# Patient Record
Sex: Male | Born: 1955 | Hispanic: Yes | Marital: Married | Smoking: Former smoker
Health system: Southern US, Community
[De-identification: ages and names within clinical notes are randomized; demographics above are authoritative.]

## PROBLEM LIST (undated history)

## (undated) DIAGNOSIS — I10 Essential (primary) hypertension: Secondary | ICD-10-CM

## (undated) DIAGNOSIS — E119 Type 2 diabetes mellitus without complications: Secondary | ICD-10-CM

## (undated) DIAGNOSIS — E7800 Pure hypercholesterolemia, unspecified: Secondary | ICD-10-CM

## (undated) HISTORY — PX: HERNIA REPAIR: SHX51

---

## 2015-07-13 ENCOUNTER — Emergency Department (HOSPITAL_COMMUNITY)
Admission: EM | Admit: 2015-07-13 | Discharge: 2015-07-13 | Disposition: A | Discharge status: Home / Self Care | Payer: Worker's Compensation | Attending: Orthopedic Surgery | Admitting: Orthopedic Surgery

## 2015-07-13 ENCOUNTER — Emergency Department (HOSPITAL_COMMUNITY): Payer: Worker's Compensation

## 2015-07-13 ENCOUNTER — Emergency Department (HOSPITAL_COMMUNITY): Payer: Worker's Compensation | Admitting: Registered Nurse

## 2015-07-13 ENCOUNTER — Inpatient Hospital Stay: Admit: 2015-07-13 | Payer: Self-pay | Admitting: Orthopedic Surgery

## 2015-07-13 ENCOUNTER — Encounter (HOSPITAL_COMMUNITY): Admission: EM | Disposition: A | Discharge status: Home / Self Care | Payer: Self-pay | Source: Home / Self Care

## 2015-07-13 ENCOUNTER — Encounter (HOSPITAL_COMMUNITY): Payer: Self-pay

## 2015-07-13 DIAGNOSIS — Z87891 Personal history of nicotine dependence: Secondary | ICD-10-CM | POA: Diagnosis not present

## 2015-07-13 DIAGNOSIS — S61341A Puncture wound with foreign body of left index finger with damage to nail, initial encounter: Secondary | ICD-10-CM | POA: Diagnosis not present

## 2015-07-13 DIAGNOSIS — S62609A Fracture of unspecified phalanx of unspecified finger, initial encounter for closed fracture: Secondary | ICD-10-CM

## 2015-07-13 DIAGNOSIS — Y9263 Factory as the place of occurrence of the external cause: Secondary | ICD-10-CM | POA: Insufficient documentation

## 2015-07-13 DIAGNOSIS — S62631B Displaced fracture of distal phalanx of left index finger, initial encounter for open fracture: Secondary | ICD-10-CM | POA: Insufficient documentation

## 2015-07-13 DIAGNOSIS — W3182XA Contact with other commercial machinery, initial encounter: Secondary | ICD-10-CM | POA: Insufficient documentation

## 2015-07-13 DIAGNOSIS — E7800 Pure hypercholesterolemia, unspecified: Secondary | ICD-10-CM | POA: Insufficient documentation

## 2015-07-13 DIAGNOSIS — Y9389 Activity, other specified: Secondary | ICD-10-CM | POA: Diagnosis not present

## 2015-07-13 DIAGNOSIS — I10 Essential (primary) hypertension: Secondary | ICD-10-CM | POA: Diagnosis not present

## 2015-07-13 DIAGNOSIS — E119 Type 2 diabetes mellitus without complications: Secondary | ICD-10-CM | POA: Diagnosis not present

## 2015-07-13 DIAGNOSIS — S6992XA Unspecified injury of left wrist, hand and finger(s), initial encounter: Secondary | ICD-10-CM | POA: Diagnosis present

## 2015-07-13 HISTORY — PX: OPEN REDUCTION INTERNAL FIXATION (ORIF) METACARPAL: SHX6234

## 2015-07-13 HISTORY — DX: Essential (primary) hypertension: I10

## 2015-07-13 HISTORY — DX: Type 2 diabetes mellitus without complications: E11.9

## 2015-07-13 HISTORY — DX: Pure hypercholesterolemia, unspecified: E78.00

## 2015-07-13 LAB — GLUCOSE, CAPILLARY: Glucose-Capillary: 131 mg/dL — ABNORMAL HIGH (ref 65–99)

## 2015-07-13 SURGERY — OPEN REDUCTION INTERNAL FIXATION (ORIF) METACARPAL
Anesthesia: Monitor Anesthesia Care | Site: Finger | Laterality: Left

## 2015-07-13 MED ORDER — MIDAZOLAM HCL 5 MG/5ML IJ SOLN
INTRAMUSCULAR | Status: DC | PRN
  Administered 2015-07-13: 1 mg via INTRAVENOUS

## 2015-07-13 MED ORDER — HYDROMORPHONE HCL 1 MG/ML IJ SOLN: 0.2500 mg | INTRAMUSCULAR | Status: DC | PRN

## 2015-07-13 MED ORDER — CEFAZOLIN SODIUM-DEXTROSE 2-3 GM-% IV SOLR
2.0000 g | Freq: Once | INTRAVENOUS | Status: AC
  Administered 2015-07-13: 2 g via INTRAVENOUS

## 2015-07-13 MED ORDER — LACTATED RINGERS IV SOLN
INTRAVENOUS | Status: DC | PRN
  Administered 2015-07-13: 18:00:00 via INTRAVENOUS

## 2015-07-13 MED ORDER — ONDANSETRON HCL 4 MG/2ML IJ SOLN
INTRAMUSCULAR | Status: DC | PRN
  Administered 2015-07-13: 4 mg via INTRAVENOUS

## 2015-07-13 MED ORDER — LABETALOL HCL 5 MG/ML IV SOLN
INTRAVENOUS | Status: DC | PRN
  Administered 2015-07-13: 2.5 mg via INTRAVENOUS

## 2015-07-13 MED ORDER — OXYCODONE-ACETAMINOPHEN 5-325 MG PO TABS: 1.0000 | ORAL_TABLET | ORAL | Status: AC | PRN

## 2015-07-13 MED ORDER — AMOXICILLIN-POT CLAVULANATE 875-125 MG PO TABS: 1.0000 | ORAL_TABLET | Freq: Two times a day (BID) | ORAL | Status: AC

## 2015-07-13 MED ORDER — FENTANYL CITRATE (PF) 100 MCG/2ML IJ SOLN
INTRAMUSCULAR | Status: DC | PRN
  Administered 2015-07-13: 50 ug via INTRAVENOUS

## 2015-07-13 MED ORDER — 0.9 % SODIUM CHLORIDE (POUR BTL) OPTIME
TOPICAL | Status: DC | PRN
  Administered 2015-07-13: 1000 mL

## 2015-07-13 MED ORDER — PROMETHAZINE HCL 25 MG/ML IJ SOLN: 6.2500 mg | INTRAMUSCULAR | Status: DC | PRN

## 2015-07-13 MED ORDER — LIDOCAINE HCL 1 % IJ SOLN
INTRAMUSCULAR | Status: DC | PRN
  Administered 2015-07-13: 5 mL

## 2015-07-13 MED ORDER — PROPOFOL 10 MG/ML IV BOLUS
INTRAVENOUS | Status: DC | PRN
  Administered 2015-07-13 (×3): 20 mg via INTRAVENOUS

## 2015-07-13 MED ORDER — LIDOCAINE HCL 2 % IJ SOLN
INTRAMUSCULAR | Status: DC | PRN
  Administered 2015-07-13: 5 mL

## 2015-07-13 SURGICAL SUPPLY — 59 items
BAG ZIPLOCK 12X15 (MISCELLANEOUS) IMPLANT
BENZOIN TINCTURE PRP APPL 2/3 (GAUZE/BANDAGES/DRESSINGS) ×3 IMPLANT
BLADE MINI RND TIP GREEN BEAV (BLADE) IMPLANT
BNDG COHESIVE 1X5 TAN STRL LF (GAUZE/BANDAGES/DRESSINGS) ×3 IMPLANT
BNDG CONFORM 3 STRL LF (GAUZE/BANDAGES/DRESSINGS) IMPLANT
BNDG GAUZE ELAST 4 BULKY (GAUZE/BANDAGES/DRESSINGS) IMPLANT
CHLORAPREP W/TINT 26ML (MISCELLANEOUS) IMPLANT
CLOSURE WOUND 1/2 X4 (GAUZE/BANDAGES/DRESSINGS) ×1
CORDS BIPOLAR (ELECTRODE) ×3 IMPLANT
CUFF TOURN SGL QUICK 18 (TOURNIQUET CUFF) ×3 IMPLANT
DECANTER SPIKE VIAL GLASS SM (MISCELLANEOUS) ×3 IMPLANT
DEPRESSOR TONGUE BLADE STERILE (MISCELLANEOUS) ×3 IMPLANT
DRAPE C-ARM 42X120 X-RAY (DRAPES) ×3 IMPLANT
DRAPE SURG 17X11 SM STRL (DRAPES) ×3 IMPLANT
ELECT REM PT RETURN 9FT ADLT (ELECTROSURGICAL)
ELECTRODE REM PT RTRN 9FT ADLT (ELECTROSURGICAL) IMPLANT
GAUZE SPONGE 4X4 12PLY STRL (GAUZE/BANDAGES/DRESSINGS) ×3 IMPLANT
GAUZE SPONGE 4X4 16PLY XRAY LF (GAUZE/BANDAGES/DRESSINGS) ×3 IMPLANT
GAUZE XEROFORM 1X8 LF (GAUZE/BANDAGES/DRESSINGS) ×3 IMPLANT
GLOVE BIO SURGEON STRL SZ7.5 (GLOVE) ×3 IMPLANT
GLOVE BIO SURGEON STRL SZ8 (GLOVE) IMPLANT
GLOVE BIOGEL PI IND STRL 7.0 (GLOVE) ×1 IMPLANT
GLOVE BIOGEL PI INDICATOR 7.0 (GLOVE) ×2
GLOVE INDICATOR 8.0 STRL GRN (GLOVE) ×3 IMPLANT
GLOVE SURG SS PI 7.0 STRL IVOR (GLOVE) ×3 IMPLANT
GOWN STRL REUS W/TWL XL LVL3 (GOWN DISPOSABLE) ×6 IMPLANT
KIT BASIN OR (CUSTOM PROCEDURE TRAY) ×3 IMPLANT
MANIFOLD NEPTUNE II (INSTRUMENTS) ×3 IMPLANT
NEEDLE HYPO 25X1 1.5 SAFETY (NEEDLE) ×3 IMPLANT
NS IRRIG 1000ML POUR BTL (IV SOLUTION) ×3 IMPLANT
PACK ORTHO EXTREMITY (CUSTOM PROCEDURE TRAY) ×3 IMPLANT
PAD CAST 3X4 CTTN HI CHSV (CAST SUPPLIES) ×1 IMPLANT
PAD CAST 4YDX4 CTTN HI CHSV (CAST SUPPLIES) ×1 IMPLANT
PADDING CAST COTTON 3X4 STRL (CAST SUPPLIES) ×2
PADDING CAST COTTON 4X4 STRL (CAST SUPPLIES) ×2
PASSER SUT SWANSON 36MM LOOP (INSTRUMENTS) IMPLANT
POSITIONER SURGICAL ARM (MISCELLANEOUS) IMPLANT
SOL PREP POV-IOD 4OZ 10% (MISCELLANEOUS) ×3 IMPLANT
SPEAR EYE SURGICAL ST (MISCELLANEOUS) IMPLANT
SPLINT PLASTER CAST XFAST 5X30 (CAST SUPPLIES) IMPLANT
SPLINT PLASTER XFAST SET 5X30 (CAST SUPPLIES)
STAPLER VISISTAT 35W (STAPLE) IMPLANT
STOCKINETTE 4X48 STRL (DRAPES) ×3 IMPLANT
STOCKINETTE 6  STRL (DRAPES)
STOCKINETTE 6 STRL (DRAPES) IMPLANT
STRIP CLOSURE SKIN 1/2X4 (GAUZE/BANDAGES/DRESSINGS) ×2 IMPLANT
SUT CHROMIC 5 0 P 3 (SUTURE) IMPLANT
SUT CHROMIC 7 0 TG140 8 (SUTURE) ×3 IMPLANT
SUT ETHILON 9 0 V 100.4 (SUTURE) IMPLANT
SUT FIBERWIRE 4-0 18 TAPR NDL (SUTURE)
SUT MERSILENE 4 0 P 3 (SUTURE) IMPLANT
SUT PROLENE 3 0 PS 2 (SUTURE) IMPLANT
SUT PROLENE 4 0 PS 2 18 (SUTURE) IMPLANT
SUT SILK 2 0 (SUTURE)
SUT SILK 2-0 18XBRD TIE 12 (SUTURE) IMPLANT
SUT VICRYL RAPIDE 4/0 PS 2 (SUTURE) ×3 IMPLANT
SUTURE FIBERWR 4-0 18 TAPR NDL (SUTURE) IMPLANT
TOWEL OR 17X26 10 PK STRL BLUE (TOWEL DISPOSABLE) ×3 IMPLANT
WATER STERILE IRR 1500ML POUR (IV SOLUTION) IMPLANT

## 2015-07-13 NOTE — Discharge Instructions (Signed)
Discharge Instructions   You have a dressing with a splint incorporated in it. Move your fingers as much as possible, making a full fist and fully opening the fist. Elevate your hand to reduce pain & swelling of the digits.  Ice over the operative site may be helpful to reduce pain & swelling.  DO NOT USE HEAT. Pain medicine has been prescribed for you.  Use your medicine as needed over the first 48 hours, and then you can begin to taper your use.  You may use Tylenol in place of your prescribed pain medication, but not IN ADDITION to it. Leave the dressing in place until you return to our office.  You may shower, but keep the bandage clean & dry.  You may drive a car when you are off of prescription pain medications and can safely control your vehicle with both hands.   Please call [phone] during normal business hours or [phone] after hours for any problems. Including the following:  - excessive redness of the incisions - drainage for more than 4 days - fever of more than 101.5 F  *Please note that pain medications will not be refilled after hours or on weekends.

## 2015-07-13 NOTE — ED Notes (Addendum)
Pt presents for hand surgery from [facility] ED.  Janee Mornhompson MD aware of Pt and sts OR is ready.   Pain score 8/10.  Pt speaks Spanish.        Pt reports having a drink and eating an orange around 1330.

## 2015-07-13 NOTE — H&P (Signed)
  ORTHOPAEDIC CONSULTATION HISTORY & PHYSICAL REQUESTING PHYSICIAN: No att. providers found  Chief Complaint: left index finger injury  HPI: Chris Curtis is a 60 y.o. male who works at [employer], and was injured earlier today, with a T-nut having been driven through the distal phalanx of his left index finger while wearing a glove.  He was evaluated at the emergency department in [place], and I was contacted by his employer and case manager, requesting that I provide further definitive care.  Arrangements were made for him to proceed to the emergency department at [facility], where I met the patient, evaluated the injury, and prepared for operative treatment.  Past Medical History  Diagnosis Date  . Diabetes mellitus without complication ([place])   . Hypertension   . High cholesterol    Past Surgical History  Procedure Laterality Date  . Hernia repair     Social History   Social History  . Marital Status: Married    Spouse Name: N/A  . Number of Children: N/A  . Years of Education: N/A   Social History Main Topics  . Smoking status: Former Research scientist (life sciences)  . Smokeless tobacco: None  . Alcohol Use: Yes     Comment: occ  . Drug Use: None  . Sexual Activity: Not Asked   Other Topics Concern  . None   Social History Narrative  . None   History reviewed. No pertinent family history. Allergies not on file Prior to Admission medications   Not on File   No results found.  Positive ROS: All other systems have been reviewed and were otherwise negative with the exception of those mentioned in the HPI and as above.  Physical Exam: Vitals: Refer to EMR. Constitutional:  WD, WN, NAD HEENT:  NCAT, EOMI Neuro/Psych:  Alert & oriented to person, place, and time; appropriate mood & affect Lymphatic: No generalized extremity edema or lymphadenopathy Extremities / MSK:  The extremities are normal with respect to appearance, ranges of motion, joint stability, muscle  strength/tone, sensation, & perfusion except as otherwise noted:  Left index finger has an impaled object that passes completely from dorsal to volar through the distal phalanx.  There are portions of the glove that have remained under the metal, the remainder of it cut away.  All of the joints of the digit can flex and extend, and the fingertip appears viable with regard to color and temperature.  Xrays:  3 views of the left index finger available from [place] revealed a metallic object through the distal phalanx, complete with traversing the digit, with a comminuted distal phalangeal fracture.  Assessment: Impaled metal object traversing the left index finger, with comminuted distal phalangeal fracture and likely significant damage to the nail complex.  Plan: I discussed with him via an interpreter a plan to address this in the operating room with sufficient anesthesia.  The neurological obviously need to be removed, at which time I observe the structures and assess the degree of damage to the involved structures and the effect repairs as able.  Questions were invited and answered.  Consent obtained.  Rayvon Char [address], [place], [place]  [address] Office: [phone] Mobile: [phone]  07/13/2015, 6:27 PM

## 2015-07-13 NOTE — Anesthesia Postprocedure Evaluation (Signed)
Anesthesia Post Note  Patient: Chris Curtis  Procedure(s) Performed: Procedure(s) (LRB): REMOVAL FOREIGN OBJECT LEFT INDEX FINGER/ OPEN TREATMENT DISTAL PHALYNX FRACTURE/ NAILBED REPAIR LEFT INDEX FINGER (Left)  Patient location during evaluation: PACU Anesthesia Type: MAC Level of consciousness: awake Pain management: pain level controlled Vital Signs Assessment: post-procedure vital signs reviewed and stable Respiratory status: spontaneous breathing Cardiovascular status: stable Anesthetic complications: no    Last Vitals:  Filed Vitals:   07/13/15 2030 07/13/15 2036  BP: 159/84 151/82  Pulse: 58 57  Temp:  36.3 C  Resp: 14 11    Last Pain:  Filed Vitals:   07/13/15 2041  PainSc: 8                  EDWARDS,Gionni Vaca

## 2015-07-13 NOTE — Op Note (Signed)
07/13/2015  6:35 PM  PATIENT:  Chris Curtis  60 y.o. male  PRE-OPERATIVE DIAGNOSIS:  Left index finger trauma with traversing metallic foreign body, comminuted distal phalangeal fracture, and significant soft tissue trauma  POST-OPERATIVE DIAGNOSIS:  Same  PROCEDURE:  Left index finger removal of traversing metallic foreign body; debridement of distal phalanx open fracture to include skin, subcutaneous tissues, and bone; open treatment of open fracture of distal phalanx; nail plate removal; nailbed repair; simple repair of skin laceration, 1.5 cm  SURGEON: Cliffton Astersavid A. Janee Mornhompson, MD  PHYSICIAN ASSISTANT: None  ANESTHESIA:  MAC  SPECIMENS:  None  DRAINS:   None  EBL:  less than 50 mL  PREOPERATIVE INDICATIONS:  Chris Curtis is a  60 y.o. male with an impaled traversing foreign body across the distal phalanx of the left index finger, an industrial T-nut associated with furniture manufacturing  The risks benefits and alternatives were discussed with the patient preoperatively including but not limited to the risks of infection, bleeding, nerve injury, cardiopulmonary complications, the need for revision surgery, among others, and the patient verbalized understanding and consented to proceed.  OPERATIVE IMPLANTS: None  OPERATIVE PROCEDURE:  After receiving prophylactic antibiotics, the patient was escorted to the operative theatre and placed in a supine position.  A surgical "time-out" was performed during which the planned procedure, proposed operative site, and the correct patient identity were compared to the operative consent and agreement confirmed by the circulating nurse according to current facility policy.  A digital block was performed by me with a mixture of lidocaine and Marcaine without epinephrine.  Following application of a tourniquet to the operative extremity, the exposed skin was prepped with Betadine and draped in the usual sterile fashion.    I initially used pliers  to decrease the size of the flare of the metal, allowing it to be then removed from the dorsum.  The nail plate was then removed from the nail bed.  The wound was copiously irrigated, and the tourniquet was inflated.  The damage was surveyed, which revealed extensive comminution to the midportion of the distal phalanx.  The skin edges were excisionally debrided, and some bone fragments were removed as well.  Some of the intervening bone fragments in the area of comminution were removed so that the remaining nail bed could be better approximated, resulting in slight shortening of the digit overall.  2 different 0.045 inch K wires were driven roughly parallel one slightly radial one slightly lateral to provide support for the comminuted fragments in the overlying nail bed.  These were clipped, ultimately they were bent over distally to lie atop the repaired nailbed.  The nail bed repair was performed with 7-0 chromic suture and loupe magnification.  There was reasonable coverage of nailbed in the region of the sterile matrix.  The germinal matrix was largely unaffected by the injury.  Final images were obtained.  The skin split on the volar and distal portion of the digit was reapproximated with 4-0 Vicryl Rapide interrupted suture.  Xeroform was placed on the nail bed and into the nail fold and then the K wires were fully bent over some that they were lying atop the Xeroform.  The tip of the digit was prepared with benzoin and Steri-Strips were used to help secure the pins from becoming dislodged distally as their purchase in the proximal portion of the phalanx was a little tenuous.  The remainder the Betadine was cleansed from the hand and digit and the digit was dressed,  with a dorsal tongue blade component to help splint the distal and middle phalanges.  The tourniquet was released.  He was taken to the recovery room in stable condition.  DISPOSITION: He'll be discharged home with analgesics and oral  antibiotics, with follow-up in the middle of next week at which time a custom protective splint Chris be fabricated.  I plan to leave the Steri-Strips and Xeroform in place for now.

## 2015-07-13 NOTE — Anesthesia Preprocedure Evaluation (Addendum)
Anesthesia Evaluation  Patient identified by MRN, date of birth, ID band Patient awake    Reviewed: Allergy & Precautions, NPO status , Patient's Chart, lab work & pertinent test results  Airway Mallampati: II  TM Distance: >3 FB Neck ROM: Full    Dental   Pulmonary former smoker,    breath sounds clear to auscultation       Cardiovascular hypertension,  Rhythm:Regular Rate:Normal     Neuro/Psych    GI/Hepatic negative GI ROS, Neg liver ROS,   Endo/Other  diabetes  Renal/GU negative Renal ROS     Musculoskeletal   Abdominal   Peds  Hematology   Anesthesia Other Findings   Reproductive/Obstetrics                           Anesthesia Physical Anesthesia Plan  ASA: II  Anesthesia Plan:    Post-op Pain Management:    Induction: Intravenous  Airway Management Planned:   Additional Equipment:   Intra-op Plan:   Post-operative Plan:   Informed Consent: I have reviewed the patients History and Physical, chart, labs and discussed the procedure including the risks, benefits and alternatives for the proposed anesthesia with the patient or authorized representative who has indicated his/her understanding and acceptance.   Dental advisory given  Plan Discussed with: CRNA, Anesthesiologist and Surgeon  Anesthesia Plan Comments:         Anesthesia Quick Evaluation

## 2015-07-13 NOTE — Transfer of Care (Signed)
Immediate Anesthesia Transfer of Care Note  Patient: Chris Curtis  Procedure(s) Performed: Procedure(s): REMOVAL FOREIGN OBJECT LEFT INDEX FINGER/ OPEN TREATMENT DISTAL PHALYNX FRACTURE/ NAILBED REPAIR LEFT INDEX FINGER (Left)  Patient Location: PACU  Anesthesia Type:MAC  Level of Consciousness: awake, alert , oriented and patient cooperative  Airway & Oxygen Therapy: Patient Spontanous Breathing  Post-op Assessment: Report given to RN, Post -op Vital signs reviewed and stable and Patient moving all extremities X 4  Post vital signs: stable  Last Vitals:  Filed Vitals:   07/13/15 1812 07/13/15 1953  BP: 179/94   Pulse: 76 64  Temp: 36.9 C   Resp: 16 12    Complications: No apparent anesthesia complications

## 2015-07-14 ENCOUNTER — Encounter (HOSPITAL_COMMUNITY): Payer: Self-pay | Admitting: Orthopedic Surgery

## 2017-09-13 IMAGING — RF DG HAND COMPLETE 3+V*L*
1 series · 4 of 4 positions shown · non-contrast
Comparison: None.

CLINICAL DATA: ORIF left index finger with foreign body removal. 10
seconds fluoro time.

EXAM:
LEFT HAND - COMPLETE 3+ VIEW

[Series 1: run · 4 of 4 slices shown]
[im 1/4]
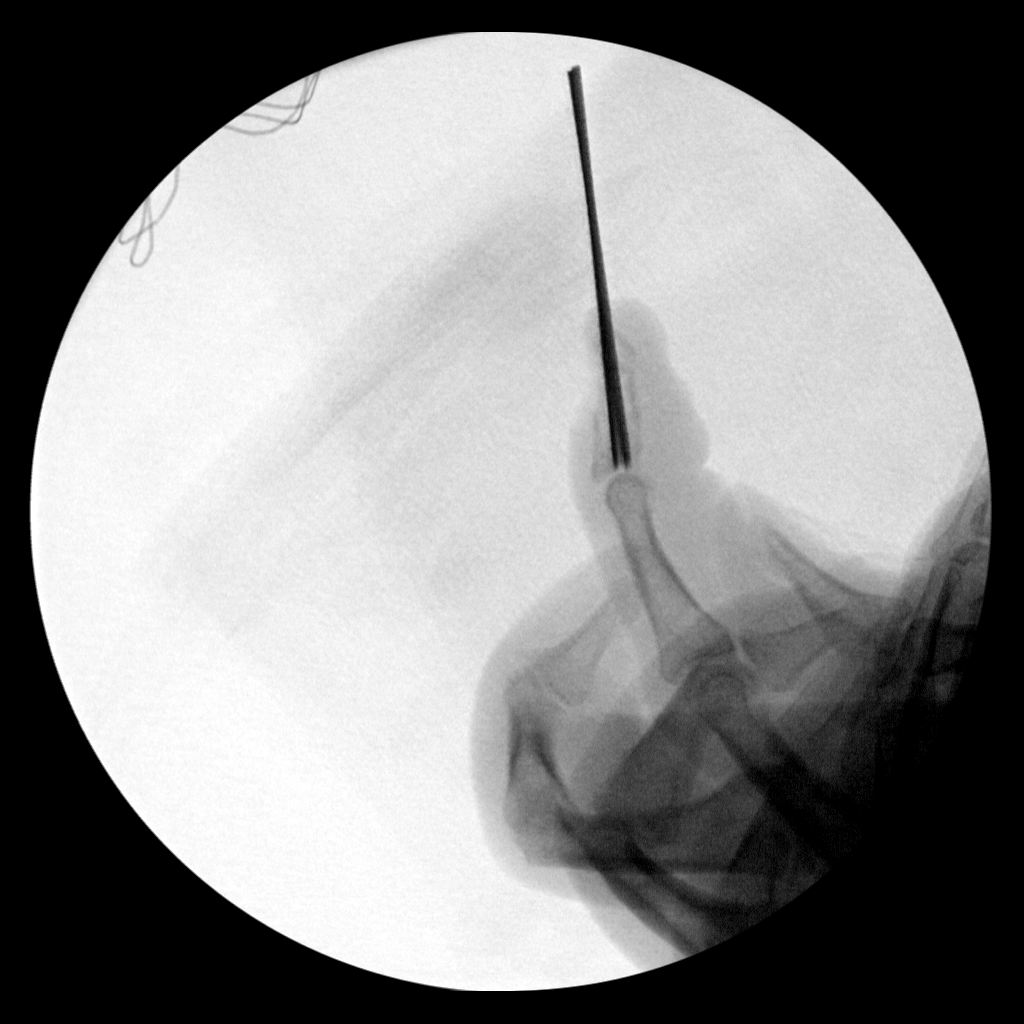
[im 2/4]
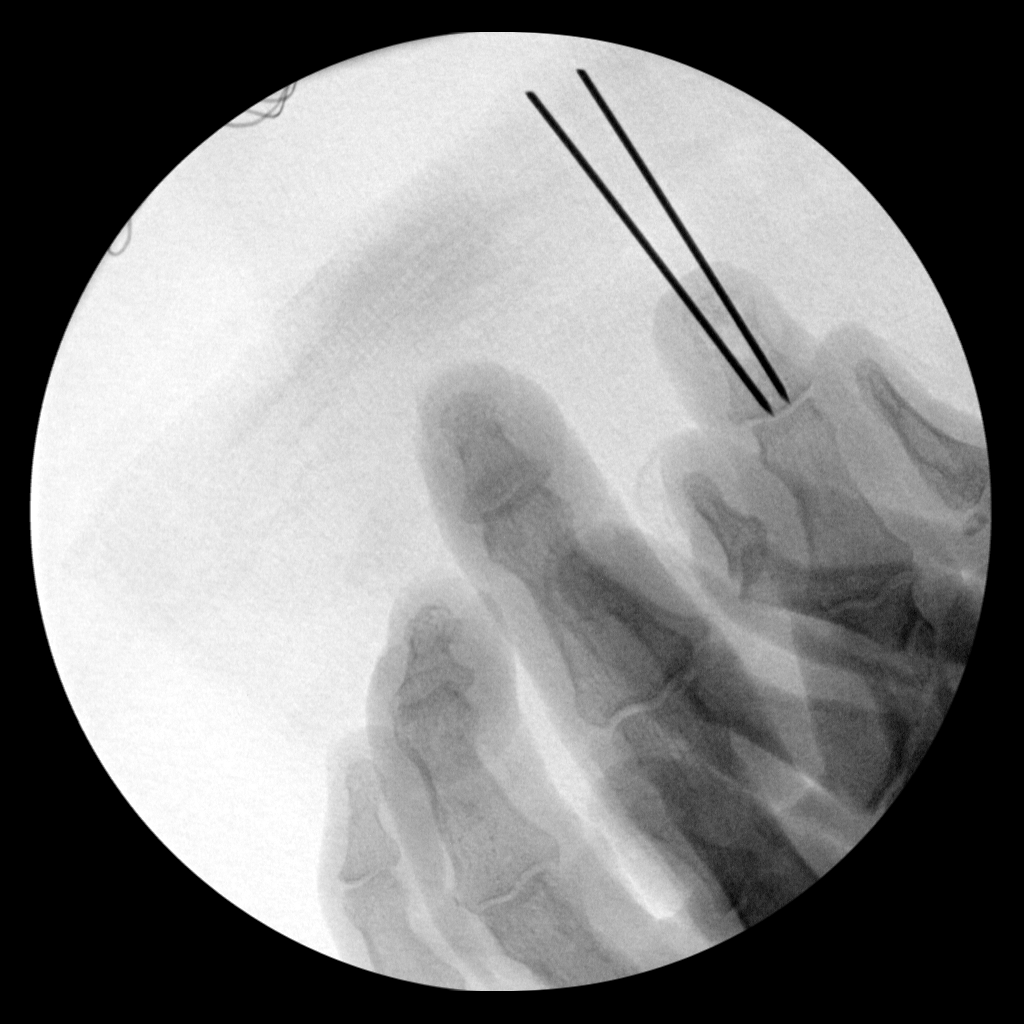
[im 3/4]
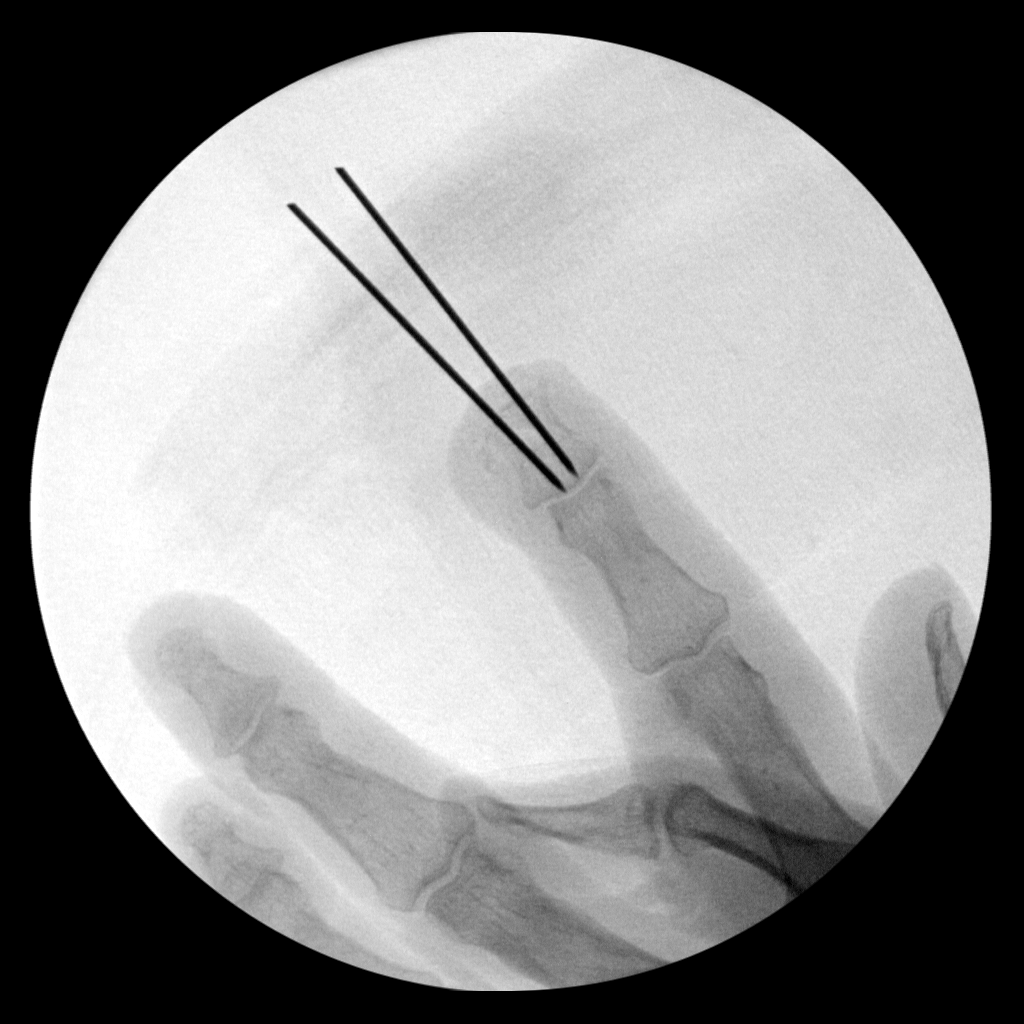
[im 4/4]
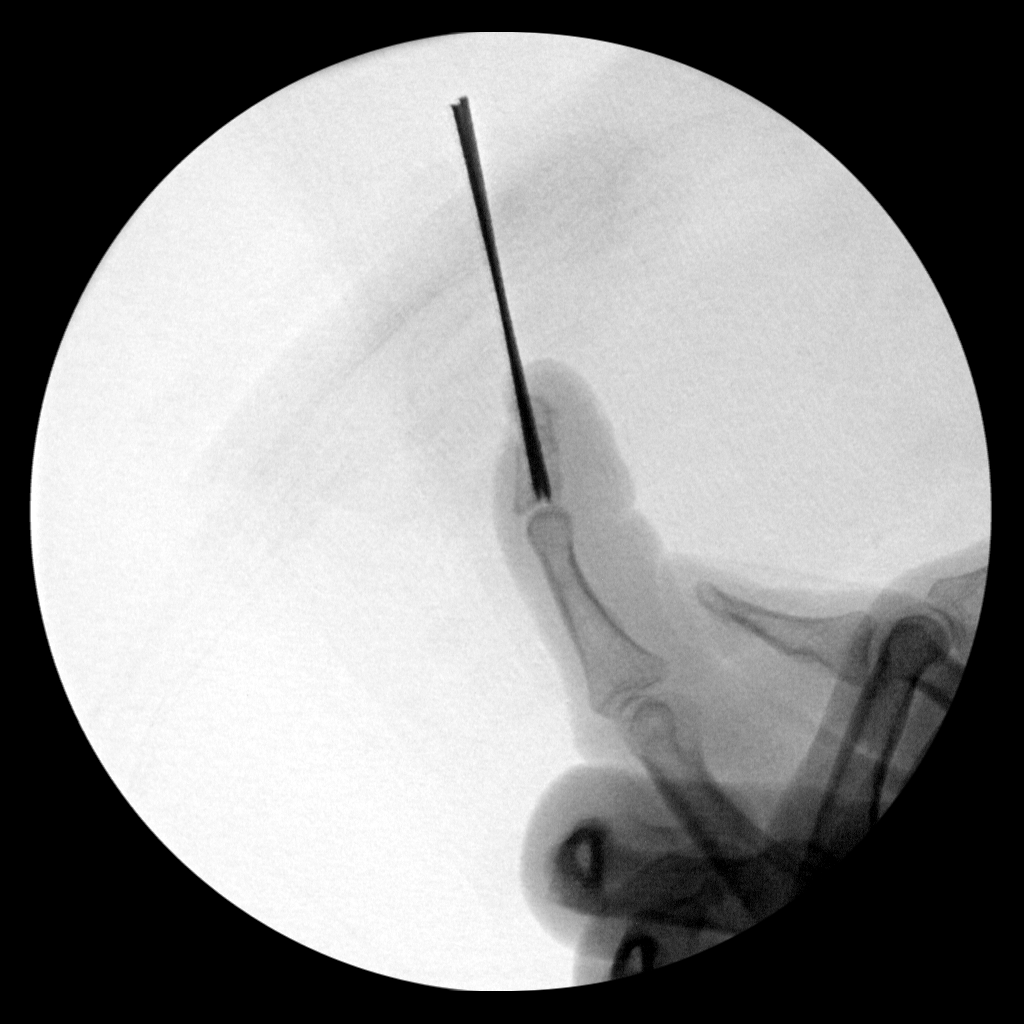

[4 of 4 positions shown; findings below may reference images not displayed]

FINDINGS: Four images are performed, demonstrating 2 K-wires longitudinally
traversing the distal phalanx of the index finger.
IMPRESSION: ORIF of the distal phalanx of the index finger.

## 2021-07-04 ENCOUNTER — Encounter: Payer: Self-pay | Admitting: Gastroenterology
